# Patient Record
Sex: Male | Born: 1995 | Race: Black or African American | Hispanic: No | Marital: Single | Smoking: Never smoker
Health system: Southern US, Community
[De-identification: ages and names within clinical notes are randomized; demographics above are authoritative.]

## PROBLEM LIST (undated history)

## (undated) ENCOUNTER — Emergency Department (HOSPITAL_COMMUNITY)
Discharge status: Against Medical Advice | Payer: Medicaid Other | Attending: Emergency Medicine | Admitting: Emergency Medicine

---
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
## 2016-09-03 ENCOUNTER — Emergency Department (HOSPITAL_COMMUNITY)
Admission: EM | Admit: 2016-09-03 | Discharge: 2016-09-03 | Disposition: A | Discharge status: Home / Self Care | Payer: Medicaid Other

## 2016-09-03 ENCOUNTER — Encounter (HOSPITAL_COMMUNITY): Payer: Self-pay | Admitting: *Deleted

## 2016-09-03 ENCOUNTER — Emergency Department (HOSPITAL_COMMUNITY)
Admission: EM | Admit: 2016-09-03 | Discharge: 2016-09-03 | Disposition: A | Discharge status: Home / Self Care | Payer: Worker's Compensation | Attending: Emergency Medicine | Admitting: Emergency Medicine

## 2016-09-03 DIAGNOSIS — W260XXA Contact with knife, initial encounter: Secondary | ICD-10-CM | POA: Diagnosis not present

## 2016-09-03 DIAGNOSIS — Y939 Activity, unspecified: Secondary | ICD-10-CM | POA: Insufficient documentation

## 2016-09-03 DIAGNOSIS — Y990 Civilian activity done for income or pay: Secondary | ICD-10-CM | POA: Insufficient documentation

## 2016-09-03 DIAGNOSIS — J45909 Unspecified asthma, uncomplicated: Secondary | ICD-10-CM | POA: Insufficient documentation

## 2016-09-03 DIAGNOSIS — Y929 Unspecified place or not applicable: Secondary | ICD-10-CM | POA: Diagnosis not present

## 2016-09-03 DIAGNOSIS — S61211A Laceration without foreign body of left index finger without damage to nail, initial encounter: Secondary | ICD-10-CM | POA: Insufficient documentation

## 2016-09-03 MED ORDER — IBUPROFEN 400 MG PO TABS: 400.0000 mg | ORAL_TABLET | Freq: Four times a day (QID) | ORAL | 0 refills | Status: AC | PRN

## 2016-09-03 NOTE — ED Triage Notes (Signed)
Pt cut his left index finger with a knife while cutting lemons yesterday at 6PM. Pt has 1.5 cm laceration to index finger

## 2016-09-03 NOTE — ED Provider Notes (Signed)
[facility]-EMERGENCY DEPT Provider Note   CSN: [id number] Arrival date & time: 09/03/16  1540  By signing my name below, I, Soijett Blue, attest that this documentation has been prepared under the direction and in the presence of Mathews RobinsonsJessica Joseguadalupe Stan, PA-C Electronically Signed: Soijett Blue, ED Scribe. 09/03/16. 4:40 PM.  History   Chief Complaint Chief Complaint  Patient presents with  . Extremity Laceration    HPI Melvin Craig is a 20 y.o. male who presents to the Emergency Department complaining of extremity laceration onset 6 PM yesterday. He notes that he was cutting lemons while at work when he cut his left index finger with a knife. He states that he is having associated symptoms of left index finger pain. Pt notes that his left index finger pain is worsened with extension and alleviated with rest. He states that he has tried advil and wound care with mild relief of his symptoms. He denies color change, joint swelling, dizziness, lightheadedness, CP, SOB, and any other symptoms. Denies allergies to any medications.    The history is provided by the patient. No language interpreter was used.    Past Medical History:  Diagnosis Date  . Asthma     Patient Active Problem List   Diagnosis Date Noted  . Asthma in pediatric patient 05/17/2014  . Rhinitis, allergic 05/17/2014    History reviewed. No pertinent surgical history.     Home Medications    Prior to Admission medications   Medication Sig Start Date End Date Taking? Authorizing Provider  albuterol (PROVENTIL HFA;VENTOLIN HFA) 108 (90 BASE) MCG/ACT inhaler Inhale 2 puffs into the lungs every 4 (four) hours as needed for wheezing. Use with spacer 05/16/14   Maree ErieAngela J Stanley, MD  albuterol (PROVENTIL) (2.5 MG/3ML) 0.083% nebulizer solution Take 2.5 mg by nebulization every 6 (six) hours as needed. For shortness of breath    Historical Provider, MD  beclomethasone (QVAR) 40 MCG/ACT inhaler Inhale 2 puffs into the lungs twice  daily for asthma prevention 05/16/14   Maree ErieAngela J Stanley, MD  cetirizine (ZYRTEC) 10 MG tablet Take 1 tablet (10 mg total) by mouth daily. 12/20/14   Maree ErieAngela J Stanley, MD  fluticasone Aleda Grana(FLONASE) 50 MCG/ACT nasal spray Inhale one spray into each nostril once daily for allergy control; rinse mouth and spit after use 12/20/14   Maree ErieAngela J Stanley, MD  ibuprofen (ADVIL,MOTRIN) 400 MG tablet Take 1 tablet (400 mg total) by mouth every 6 (six) hours as needed. 09/03/16   Georgiana ShoreJessica B Rylei Masella, PA-C    Family History Family History  Problem Relation Age of Onset  . Autism Brother     Social History Social History  Substance Use Topics  . Smoking status: Never Smoker  . Smokeless tobacco: Never Used  . Alcohol use Not on file     Allergies   Food   Review of Systems Review of Systems  Constitutional: Negative for chills and fever.  HENT: Negative for congestion and sore throat.   Respiratory: Negative for chest tightness, shortness of breath and wheezing.   Cardiovascular: Negative for chest pain.  Gastrointestinal: Negative for nausea and vomiting.  Musculoskeletal: Negative for joint swelling. Arthralgias: left index finger.  Skin: Positive for wound (laceration to left index). Negative for color change.  Neurological: Negative for dizziness, syncope, weakness, light-headedness and numbness.       Tingling to left index finger  All other systems reviewed and are negative.   Physical Exam Updated Vital Signs BP 137/66 (BP Location: Right Arm)  Pulse (!) 58   Temp 98.4 F (36.9 C) (Oral)   Resp 18   SpO2 100%   Physical Exam  Constitutional: He appears well-developed and well-nourished. No distress.  Patient is non-toxic appearing, sitting comfortably in chair, no acute distress  HENT:  Head: Normocephalic and atraumatic.  Eyes: EOM are normal. Right eye exhibits no discharge. Left eye exhibits no discharge.  Neck: Normal range of motion. Neck supple.  Cardiovascular: Normal  rate, regular rhythm and normal heart sounds.  Exam reveals no gallop and no friction rub.   No murmur heard. Pulmonary/Chest: Effort normal and breath sounds normal. No respiratory distress. He has no wheezes. He has no rales.  Abdominal: He exhibits no distension.  Musculoskeletal: Normal range of motion. He exhibits no edema or deformity.       Left hand: He exhibits laceration. He exhibits normal range of motion and normal capillary refill. Normal sensation noted. Normal strength noted.  Approximately 1 cm U-shaped superficial laceration of distal left index finger. FROM. Good sensation distally. Even resistance strength. No active bleeding. Good cap refill.   Neurological: He is alert.  Skin: Skin is warm and dry. Capillary refill takes less than 2 seconds. He is not diaphoretic. No erythema. No pallor.  Psychiatric: He has a normal mood and affect. His behavior is normal.  Nursing note and vitals reviewed.   ED Treatments / Results  DIAGNOSTIC STUDIES: Oxygen Saturation is 100% on RA, nl by my interpretation.    COORDINATION OF CARE: 4:32 PM Discussed treatment plan with pt at bedside which includes wound care and pt agreed to plan.   Procedures Procedures (including critical care time)  Medications Ordered in ED Medications - No data to display   Initial Impression / Assessment and Plan / ED Course  I have reviewed the triage vital signs and the nursing notes.   Clinical Course    Patient has had superficial laceration from slicing lemons at work more than 12 hours ago. Bleeding and pain have been well-controlled. Good cap refills, full range of motion, good strength and sensation. Imaging not indicated.  Finger was cleaned, dressed and placed in a splint to reduce movement of finger and reopening of the wound to allow healing. Laceration repair not indicated at this time since injury occurred nearly 24 hours ago. Wound is clean without debris or foreign body and is  involving superficial skin.  Discharge home with symptomatic relief and PCP follow up as needed.  Discussed strict return precautions. Patient was advised to return to the emergency department if experiencing any worsening of symptoms and to follow up with PCP as needed and he understood.  Patient was discussed with Roxy Horsemanobert Browning PA-C, who agrees with assessment and plan.  Final Clinical Impressions(s) / ED Diagnoses   Final diagnoses:  Laceration of left index finger without foreign body without damage to nail, initial encounter    New Prescriptions New Prescriptions   IBUPROFEN (ADVIL,MOTRIN) 400 MG TABLET    Take 1 tablet (400 mg total) by mouth every 6 (six) hours as needed.   I personally performed the services described in this documentation, which was scribed in my presence. The recorded information has been reviewed and is accurate.    Georgiana ShoreJessica B Jeaninne Lodico, PA-C 09/03/16 1707    Gerhard Munchobert Lockwood, MD 09/03/16 [phone]

## 2017-07-16 ENCOUNTER — Encounter (HOSPITAL_COMMUNITY): Payer: Self-pay | Admitting: *Deleted

## 2017-07-16 ENCOUNTER — Emergency Department (HOSPITAL_COMMUNITY)
Admission: EM | Admit: 2017-07-16 | Discharge: 2017-07-16 | Disposition: A | Discharge status: Home / Self Care | Payer: Medicaid Other | Attending: Emergency Medicine | Admitting: Emergency Medicine

## 2017-07-16 DIAGNOSIS — R300 Dysuria: Secondary | ICD-10-CM | POA: Diagnosis not present

## 2017-07-16 DIAGNOSIS — R369 Urethral discharge, unspecified: Secondary | ICD-10-CM | POA: Insufficient documentation

## 2017-07-16 DIAGNOSIS — Z79899 Other long term (current) drug therapy: Secondary | ICD-10-CM | POA: Diagnosis not present

## 2017-07-16 DIAGNOSIS — Z202 Contact with and (suspected) exposure to infections with a predominantly sexual mode of transmission: Secondary | ICD-10-CM | POA: Diagnosis not present

## 2017-07-16 DIAGNOSIS — Z791 Long term (current) use of non-steroidal anti-inflammatories (NSAID): Secondary | ICD-10-CM | POA: Diagnosis not present

## 2017-07-16 DIAGNOSIS — J45909 Unspecified asthma, uncomplicated: Secondary | ICD-10-CM | POA: Diagnosis not present

## 2017-07-16 LAB — URINALYSIS, ROUTINE W REFLEX MICROSCOPIC
BILIRUBIN URINE: NEGATIVE
Bacteria, UA: NONE SEEN
GLUCOSE, UA: NEGATIVE mg/dL
HGB URINE DIPSTICK: NEGATIVE
KETONES UR: NEGATIVE mg/dL
NITRITE: NEGATIVE
PH: 7 (ref 5.0–8.0)
Protein, ur: NEGATIVE mg/dL
Specific Gravity, Urine: 1.015 (ref 1.005–1.030)
Squamous Epithelial / LPF: NONE SEEN

## 2017-07-16 MED ORDER — CEFTRIAXONE SODIUM 250 MG IJ SOLR
250.0000 mg | Freq: Once | INTRAMUSCULAR | Status: AC
  Administered 2017-07-16: 250 mg via INTRAMUSCULAR
  Filled 2017-07-16: qty 250

## 2017-07-16 MED ORDER — STERILE WATER FOR INJECTION IJ SOLN
INTRAMUSCULAR | Status: DC
Start: 2017-07-16 — End: 2017-07-16
  Filled 2017-07-16: qty 10

## 2017-07-16 MED ORDER — AZITHROMYCIN 250 MG PO TABS
1000.0000 mg | ORAL_TABLET | Freq: Once | ORAL | Status: AC
  Administered 2017-07-16: 1000 mg via ORAL
  Filled 2017-07-16: qty 4

## 2017-07-16 NOTE — ED Provider Notes (Signed)
[facility]-EMERGENCY DEPT Provider Note   CSN: [id number] Arrival date & time: 07/16/17  1445     History   Chief Complaint Chief Complaint  Patient presents with  . SEXUALLY TRANSMITTED DISEASE    HPI Melvin Craig is a 21 y.o. male.  HPI   Melvin Craig is a 21 y.o. male, with a history of Asthma, presenting to the ED with dysuria and yellow penile discharge beginning this morning. Unprotected sex Oct 18 with random male.  Denies hematuria, penile swelling, testicular pain/swelling, lesions, fever/chills, N/V, abdominal pain, or any other complaints. Denies history of STD.      Past Medical History:  Diagnosis Date  . Asthma     Patient Active Problem List   Diagnosis Date Noted  . Asthma in pediatric patient 05/17/2014  . Rhinitis, allergic 05/17/2014    History reviewed. No pertinent surgical history.     Home Medications    Prior to Admission medications   Medication Sig Start Date End Date Taking? Authorizing Provider  albuterol (PROVENTIL HFA;VENTOLIN HFA) 108 (90 BASE) MCG/ACT inhaler Inhale 2 puffs into the lungs every 4 (four) hours as needed for wheezing. Use with spacer 05/16/14   Maree Erie, MD  albuterol (PROVENTIL) (2.5 MG/3ML) 0.083% nebulizer solution Take 2.5 mg by nebulization every 6 (six) hours as needed. For shortness of breath    [provider]  beclomethasone (QVAR) 40 MCG/ACT inhaler Inhale 2 puffs into the lungs twice daily for asthma prevention 05/16/14   Maree Erie, MD  cetirizine (ZYRTEC) 10 MG tablet Take 1 tablet (10 mg total) by mouth daily. 12/20/14   Maree Erie, MD  fluticasone Aleda Grana) 50 MCG/ACT nasal spray Inhale one spray into each nostril once daily for allergy control; rinse mouth and spit after use 12/20/14   Maree Erie, MD  ibuprofen (ADVIL,MOTRIN) 400 MG tablet Take 1 tablet (400 mg total) by mouth every 6 (six) hours as needed. 09/03/16   Georgiana Shore, PA-C      Family History Family History  Problem Relation Age of Onset  . Autism Brother     Social History Social History  Substance Use Topics  . Smoking status: Never Smoker  . Smokeless tobacco: Never Used  . Alcohol use Not on file     Allergies   Food   Review of Systems Review of Systems  Constitutional: Negative for chills, diaphoresis and fever.  Gastrointestinal: Negative for abdominal pain, diarrhea, nausea and vomiting.  Genitourinary: Positive for discharge and dysuria. Negative for hematuria, scrotal swelling and testicular pain.  Musculoskeletal: Negative for back pain.     Physical Exam Updated Vital Signs BP (!) 145/81 (BP Location: Right Arm)   Pulse (!) 51   Temp 98.1 F (36.7 C) (Oral)   Resp 18   SpO2 100%   Physical Exam  Constitutional: He appears well-developed and well-nourished. No distress.  HENT:  Head: Normocephalic and atraumatic.  Eyes: Conjunctivae are normal.  Neck: Neck supple.  Cardiovascular: Normal rate and regular rhythm.   Pulmonary/Chest: Effort normal.  Genitourinary: Circumcised. No penile tenderness. Discharge found.  Genitourinary Comments: Moderate, thick, yellow penile discharge.  Penis, scrotum, and testicles without swelling, lesions, or tenderness. Cremasteric reflex intact. No inguinal lymphadenopathy. Otherwise normal male genitalia. PA student, Baxter Hire, served as chaperone during the exam.  Neurological: He is alert.  Skin: Skin is warm and dry. He is not diaphoretic. No pallor.  Psychiatric: He has a normal mood and affect. His  behavior is normal.  Nursing note and vitals reviewed.    ED Treatments / Results  Labs (all labs ordered are listed, but only abnormal results are displayed) Labs Reviewed  URINALYSIS, ROUTINE W REFLEX MICROSCOPIC - Abnormal; Notable for the following:       Result Value   Leukocytes, UA MODERATE (*)    All other components within normal limits  RPR  HIV ANTIBODY (ROUTINE TESTING)   GC/CHLAMYDIA PROBE AMP ([place]) NOT AT [facility]    EKG  EKG Interpretation None       Radiology No results found.  Procedures Procedures (including critical care time)  Medications Ordered in ED Medications  azithromycin (ZITHROMAX) tablet 1,000 mg (not administered)  cefTRIAXone (ROCEPHIN) injection 250 mg (not administered)     Initial Impression / Assessment and Plan / ED Course  I have reviewed the triage vital signs and the nursing notes.  Pertinent labs & imaging results that were available during my care of the patient were reviewed by me and considered in my medical decision making (see chart for details).     Patient presents with penile discharge and dysuria.  Tested and empirically treated. Safe sex practices discussed.      Final Clinical Impressions(s) / ED Diagnoses   Final diagnoses:  Penile discharge    New Prescriptions New Prescriptions   No medications on file     Concepcion LivingJoy, Ainhoa Rallo C, PA-C 07/16/17 2011    Bethann BerkshireZammit, Joseph, MD 07/17/17 [phone]

## 2017-07-16 NOTE — ED Triage Notes (Signed)
Pt complains of burning while urinating and yellow discharge since last night. Pt states he had unprotected sex 3 days ago.

## 2017-07-16 NOTE — Discharge Instructions (Signed)
You have been tested for STDs. Some of these results are still pending. Any abnormalities will be called to you. You have been empirically treated for gonorrhea and Chlamydia. This does not mean you necessarily have these diseases, treatment is precautionary. Be sure to follow safe sex practices, including monogamy and/or condom use. °For the treatment to be fully effective, avoid all sexual contact for at least 2 weeks after medication administration. °

## 2017-07-17 LAB — RPR: RPR Ser Ql: NONREACTIVE

## 2017-07-17 LAB — HIV ANTIBODY (ROUTINE TESTING W REFLEX): HIV SCREEN 4TH GENERATION: NONREACTIVE

## 2017-07-19 LAB — GC/CHLAMYDIA PROBE AMP (~~LOC~~) NOT AT ARMC
CHLAMYDIA, DNA PROBE: POSITIVE — AB
NEISSERIA GONORRHEA: POSITIVE — AB

## 2021-02-06 ENCOUNTER — Encounter (HOSPITAL_COMMUNITY): Payer: Self-pay

## 2021-02-06 ENCOUNTER — Other Ambulatory Visit: Payer: Self-pay

## 2021-02-06 ENCOUNTER — Emergency Department (HOSPITAL_COMMUNITY)
Admission: EM | Admit: 2021-02-06 | Discharge: 2021-02-06 | Disposition: A | Discharge status: Home / Self Care | Payer: Medicaid Other | Attending: Emergency Medicine | Admitting: Emergency Medicine

## 2021-02-06 DIAGNOSIS — J069 Acute upper respiratory infection, unspecified: Secondary | ICD-10-CM | POA: Insufficient documentation

## 2021-02-06 DIAGNOSIS — J45909 Unspecified asthma, uncomplicated: Secondary | ICD-10-CM | POA: Insufficient documentation

## 2021-02-06 DIAGNOSIS — U071 COVID-19: Secondary | ICD-10-CM | POA: Insufficient documentation

## 2021-02-06 DIAGNOSIS — Z28310 Unvaccinated for covid-19: Secondary | ICD-10-CM | POA: Insufficient documentation

## 2021-02-06 DIAGNOSIS — Z20822 Contact with and (suspected) exposure to covid-19: Secondary | ICD-10-CM

## 2021-02-06 DIAGNOSIS — Z7951 Long term (current) use of inhaled steroids: Secondary | ICD-10-CM | POA: Insufficient documentation

## 2021-02-06 MED ORDER — FLUTICASONE PROPIONATE 50 MCG/ACT NA SUSP: 2.0000 | Freq: Every day | NASAL | 0 refills | Status: AC

## 2021-02-06 MED ORDER — ALBUTEROL SULFATE HFA 108 (90 BASE) MCG/ACT IN AERS: 1.0000 | INHALATION_SPRAY | Freq: Four times a day (QID) | RESPIRATORY_TRACT | 0 refills | Status: AC | PRN

## 2021-02-06 MED ORDER — CETIRIZINE HCL 10 MG PO TABS: 10.0000 mg | ORAL_TABLET | Freq: Every day | ORAL | 0 refills | Status: AC

## 2021-02-06 MED ORDER — BENZONATATE 100 MG PO CAPS: 100.0000 mg | ORAL_CAPSULE | Freq: Three times a day (TID) | ORAL | 0 refills | Status: DC

## 2021-02-06 NOTE — ED Provider Notes (Signed)
[facility]-EMERGENCY DEPT Provider Note   CSN: [id number] Arrival date & time: 02/06/21  1319     History Chief Complaint  Patient presents with  . Nasal Congestion  . Cough    Melvin Craig is a 25 y.o. male with past medical history significant for asthma who presents for evaluation of rhinorrhea.  States he has had a intermittent cough.  Symptoms began 2 days ago.  He has no chest pain or shortness of breath.  States he has had an exposure to COVID as multiple coworkers have tested positive.  He is not vaccinated.  He denies fever, chills, nausea, vomiting, hemoptysis abdominal pain, diarrhea, dysuria, unilateral leg swelling, redness or warmth.  He is not take anything for symptoms.  Denies additional aggravating or alleviating factor.  History obtained from patient and past medical records.  No interpreter used.  HPI     Past Medical History:  Diagnosis Date  . Asthma     Patient Active Problem List   Diagnosis Date Noted  . Asthma in pediatric patient 05/17/2014  . Rhinitis, allergic 05/17/2014    History reviewed. No pertinent surgical history.     Family History  Problem Relation Age of Onset  . Autism Brother     Social History   Tobacco Use  . Smoking status: Never Smoker  . Smokeless tobacco: Never Used  Vaping Use  . Vaping Use: Every day  . Substances: Nicotine, Flavoring  Substance Use Topics  . Alcohol use: Never  . Drug use: Never    Home Medications Prior to Admission medications   Medication Sig Start Date End Date Taking? Authorizing Provider  albuterol (VENTOLIN HFA) 108 (90 Base) MCG/ACT inhaler Inhale 1-2 puffs into the lungs every 6 (six) hours as needed for wheezing or shortness of breath. 02/06/21  Yes Daine Croker A, PA-C  benzonatate (TESSALON) 100 MG capsule Take 1 capsule (100 mg total) by mouth every 8 (eight) hours. 02/06/21  Yes Husain Costabile A, PA-C  cetirizine (ZYRTEC ALLERGY) 10 MG tablet Take 1  tablet (10 mg total) by mouth daily. 02/06/21  Yes Selassie Spatafore A, PA-C  fluticasone (FLONASE) 50 MCG/ACT nasal spray Place 2 sprays into both nostrils daily. 02/06/21  Yes Semaja Lymon A, PA-C  beclomethasone (QVAR) 40 MCG/ACT inhaler Inhale 2 puffs into the lungs twice daily for asthma prevention 05/16/14   Maree Erie, MD  ibuprofen (ADVIL,MOTRIN) 400 MG tablet Take 1 tablet (400 mg total) by mouth every 6 (six) hours as needed. 09/03/16   Georgiana Shore, PA-C    Allergies    Food  Review of Systems   Review of Systems  Constitutional: Negative.   HENT: Positive for congestion, postnasal drip and rhinorrhea. Negative for trouble swallowing and voice change.   Respiratory: Positive for cough. Negative for apnea, choking, chest tightness, shortness of breath, wheezing and stridor.   Cardiovascular: Negative.   Gastrointestinal: Negative.   Genitourinary: Negative.   Musculoskeletal: Negative.   Skin: Negative.   Neurological: Negative.   All other systems reviewed and are negative.   Physical Exam Updated Vital Signs BP (!) 162/83 (BP Location: Left Arm)   Pulse (!) 59   Temp 98.3 F (36.8 C) (Oral)   Resp 16   Ht 6\' 6"  (1.981 m)   Wt 77.1 kg   SpO2 98%   BMI 19.65 kg/m   Physical Exam Vitals and nursing note reviewed.  Constitutional:      General: He is not in  acute distress.    Appearance: He is well-developed. He is not ill-appearing, toxic-appearing or diaphoretic.  HENT:     Head: Normocephalic and atraumatic.     Nose: Rhinorrhea present.     Comments: Clear rhinorrhea bilateral nares.  No tenderness over frontal or maxillary sinuses    Mouth/Throat:     Comments: Posterior oropharynx clear.  Uvula midline.  No evidence of PTA or RPA.  No pooling of secretions. Eyes:     Pupils: Pupils are equal, round, and reactive to light.  Neck:     Comments: No neck stiffness or neck rigidity Cardiovascular:     Rate and Rhythm: Normal rate and regular  rhythm.     Pulses: Normal pulses.     Heart sounds: Normal heart sounds.     Comments: No murmur Pulmonary:     Effort: Pulmonary effort is normal. No respiratory distress.     Breath sounds: Normal breath sounds.     Comments: Clear to auscultation bilaterally.  Speaks in full sentences without difficulty Abdominal:     General: Bowel sounds are normal. There is no distension.     Palpations: Abdomen is soft.     Comments: Soft  Musculoskeletal:        General: Normal range of motion.     Cervical back: Normal range of motion and neck supple.     Comments: Moves all 4 extremities without difficulty  Skin:    General: Skin is warm and dry.     Capillary Refill: Capillary refill takes less than 2 seconds.  Neurological:     General: No focal deficit present.     Mental Status: He is alert and oriented to person, place, and time.     Comments: Cranial nerves 2-12 grossly intact Gait without ataxia     ED Results / Procedures / Treatments   Labs (all labs ordered are listed, but only abnormal results are displayed) Labs Reviewed  SARS CORONAVIRUS 2 (TAT 6-24 HRS)    EKG None  Radiology No results found.  Procedures Procedures   Medications Ordered in ED Medications - No data to display  ED Course  I have reviewed the triage vital signs and the nursing notes.  Pertinent labs & imaging results that were available during my care of the patient were reviewed by me and considered in my medical decision making (see chart for details).  25 year old here for evaluation of rhinorrhea and cough which began 2 days ago.  He is afebrile, nonseptic, non-ill-appearing.  Clear rhinorrhea bilateral nares.  Nontender frontal or maxillary sinuses.  His posterior oropharynx is clear.  No neck stiffness or neck rigidity.  No meningismus.  His heart and lungs are clear.  Abdomen soft, nontender.  No rashes or lesions.  He is nonfocal neuro exam without deficits.  Recent exposure to  multiple coworkers were positive for COVID, patient is not vaccinated.  Patient likely with viral URI.  Discussed symptomatic management.  Orders placed for COVID test.  Work note provided.  Of note patient noted to be hypertensive here.  I have low suspicion for acute hypertensive urgency or emergency.  I did recheck this in the room and has improved into the 140s systolic.  Discussed with patient close PCP follow-up for recheck of blood pressure.  He is agreeable.  The patient has been appropriately medically screened and/or stabilized in the ED. I have low suspicion for any other emergent medical condition which would require further screening, evaluation or treatment  in the ED or require inpatient management.  Patient is hemodynamically stable and in no acute distress.  Patient able to ambulate in department prior to ED.  Evaluation does not show acute pathology that would require ongoing or additional emergent interventions while in the emergency department or further inpatient treatment.  I have discussed the diagnosis with the patient and answered all questions.  Pain is been managed while in the emergency department and patient has no further complaints prior to discharge.  Patient is comfortable with plan discussed in room and is stable for discharge at this time.  I have discussed strict return precautions for returning to the emergency department.  Patient was encouraged to follow-up with PCP/specialist refer to at discharge.    MDM Rules/Calculators/A&P                         Kristofer Schaffert was evaluated in Emergency Department on 02/06/2021 for the symptoms described in the history of present illness. He was evaluated in the context of the global COVID-19 pandemic, which necessitated consideration that the patient might be at risk for infection with the SARS-CoV-2 virus that causes COVID-19. Institutional protocols and algorithms that pertain to the evaluation of patients at risk for COVID-19  are in a state of rapid change based on information released by regulatory bodies including the CDC and federal and state organizations. These policies and algorithms were followed during the patient's care in the ED. Final Clinical Impression(s) / ED Diagnoses Final diagnoses:  Viral URI with cough  Person under investigation for COVID-19    Rx / DC Orders ED Discharge Orders         Ordered    fluticasone (FLONASE) 50 MCG/ACT nasal spray  Daily        02/06/21 1436    albuterol (VENTOLIN HFA) 108 (90 Base) MCG/ACT inhaler  Every 6 hours PRN        02/06/21 1436    benzonatate (TESSALON) 100 MG capsule  Every 8 hours        02/06/21 1436    cetirizine (ZYRTEC ALLERGY) 10 MG tablet  Daily        02/06/21 1436           Tenee Wish A, PA-C 02/06/21 1437    Little, Ambrose Finland, MD 02/06/21 1515

## 2021-02-06 NOTE — ED Triage Notes (Signed)
Patient co nasal congestion and a cough x 2 days. patient also reports fatigue 3 days ago, but not now.

## 2021-02-06 NOTE — Discharge Instructions (Signed)
Your COVID test is pending.  I have written for a few medications to help with your symptoms.  If you receive a negative test result you may return to work.  If positive you will need to be out for 1 week.  Return for any worsening symptoms

## 2021-02-07 LAB — SARS CORONAVIRUS 2 (TAT 6-24 HRS): SARS Coronavirus 2: POSITIVE — AB

## 2021-02-08 ENCOUNTER — Telehealth: Payer: Self-pay | Admitting: Adult Health

## 2021-02-08 NOTE — Telephone Encounter (Signed)
Called to discuss with patient about COVID-19 symptoms and the use of one of the available treatments for those with mild to moderate Covid symptoms and at a high risk of hospitalization.  Pt appears to qualify for outpatient treatment due to co-morbid conditions and/or a member of an at-risk group in accordance with the FDA Emergency Use Authorization.    Symptom onset: 02/04/2021 Vaccinated: no Booster? no Immunocompromised? no Qualifiers: chronic lung disease/social risk NIH Criteria: 2  Unable to reach pt - unable to leave message.     Noreene Filbert

## 2022-08-11 ENCOUNTER — Ambulatory Visit
Admission: EM | Admit: 2022-08-11 | Discharge: 2022-08-11 | Disposition: A | Discharge status: Home / Self Care | Payer: PRIVATE HEALTH INSURANCE | Attending: Physician Assistant | Admitting: Physician Assistant

## 2022-08-11 DIAGNOSIS — J40 Bronchitis, not specified as acute or chronic: Secondary | ICD-10-CM

## 2022-08-11 DIAGNOSIS — J329 Chronic sinusitis, unspecified: Secondary | ICD-10-CM

## 2022-08-11 MED ORDER — PREDNISONE 20 MG PO TABS: 40.0000 mg | ORAL_TABLET | Freq: Every day | ORAL | 0 refills | Status: AC

## 2022-08-11 MED ORDER — AMOXICILLIN-POT CLAVULANATE 875-125 MG PO TABS: 1.0000 | ORAL_TABLET | Freq: Two times a day (BID) | ORAL | 0 refills | Status: AC

## 2022-08-11 NOTE — ED Provider Notes (Signed)
[facility]    CSN: [id number] Arrival date & time: 08/11/22  1029      History   Chief Complaint Chief Complaint  Patient presents with   URI    HPI Melvin Craig is a 26 y.o. male.   Patient here today for ration of respiratory symptoms has been ongoing for a week. He has continued to have congestion, cough, sore throat. He states he did have fever yesterday of 100.  He notes that cough seems to be more continuous and feels similar to when he had asthma as a child.  He has not had any diarrhea but does report some vomiting yesterday.   The history is provided by the patient.  URI Presenting symptoms: congestion, cough, fever and sore throat   Presenting symptoms: no ear pain     Past Medical History:  Diagnosis Date   Asthma     Patient Active Problem List   Diagnosis Date Noted   Asthma in pediatric patient 05/17/2014   Rhinitis, allergic 05/17/2014    History reviewed. No pertinent surgical history.     Home Medications    Prior to Admission medications   Medication Sig Start Date End Date Taking? Authorizing Provider  amoxicillin-clavulanate (AUGMENTIN) 875-125 MG tablet Take 1 tablet by mouth every 12 (twelve) hours. 08/11/22  Yes Tomi Bamberger, PA-C  predniSONE (DELTASONE) 20 MG tablet Take 2 tablets (40 mg total) by mouth daily with breakfast for 5 days. 08/11/22 08/16/22 Yes Tomi Bamberger, PA-C  albuterol (VENTOLIN HFA) 108 (90 Base) MCG/ACT inhaler Inhale 1-2 puffs into the lungs every 6 (six) hours as needed for wheezing or shortness of breath. 02/06/21   Henderly, Britni A, PA-C  beclomethasone (QVAR) 40 MCG/ACT inhaler Inhale 2 puffs into the lungs twice daily for asthma prevention 05/16/14   Maree Erie, MD  benzonatate (TESSALON) 100 MG capsule Take 1 capsule (100 mg total) by mouth every 8 (eight) hours. Patient not taking: Reported on 08/11/2022 02/06/21   Henderly, Britni A, PA-C  cetirizine (ZYRTEC ALLERGY) 10 MG tablet  Take 1 tablet (10 mg total) by mouth daily. 02/06/21   Henderly, Britni A, PA-C  fluticasone (FLONASE) 50 MCG/ACT nasal spray Place 2 sprays into both nostrils daily. 02/06/21   Henderly, Britni A, PA-C  ibuprofen (ADVIL,MOTRIN) 400 MG tablet Take 1 tablet (400 mg total) by mouth every 6 (six) hours as needed. 09/03/16   Georgiana Shore, PA-C    Family History Family History  Problem Relation Age of Onset   Autism Brother     Social History Social History   Tobacco Use   Smoking status: Never   Smokeless tobacco: Never  Vaping Use   Vaping Use: Some days   Substances: Nicotine, Flavoring  Substance Use Topics   Alcohol use: Never   Drug use: Never     Allergies   Food   Review of Systems Review of Systems  Constitutional:  Positive for fever.  HENT:  Positive for congestion and sore throat. Negative for ear pain.   Eyes:  Negative for discharge and redness.  Respiratory:  Positive for cough. Negative for shortness of breath.   Gastrointestinal:  Positive for vomiting. Negative for abdominal pain, diarrhea and nausea.     Physical Exam Triage Vital Signs ED Triage Vitals  Enc Vitals Group     BP --      Pulse Rate 08/11/22 1123 67     Resp 08/11/22 1123 19  Temp 08/11/22 1123 98.1 F (36.7 C)     Temp Source 08/11/22 1123 Oral     SpO2 08/11/22 1123 98 %     Weight --      Height --      Head Circumference --      Peak Flow --      Pain Score 08/11/22 1122 4     Pain Loc --      Pain Edu? --      Excl. in GC? --    No data found.  Updated Vital Signs Pulse 67   Temp 98.1 F (36.7 C) (Oral)   Resp 19   SpO2 98%      Physical Exam Vitals and nursing note reviewed.  Constitutional:      General: He is not in acute distress.    Appearance: Normal appearance. He is not ill-appearing.  HENT:     Head: Normocephalic and atraumatic.     Nose: Congestion present.     Mouth/Throat:     Mouth: Mucous membranes are moist.     Pharynx: Oropharynx  is clear. No oropharyngeal exudate or posterior oropharyngeal erythema.  Eyes:     Conjunctiva/sclera: Conjunctivae normal.  Cardiovascular:     Rate and Rhythm: Normal rate and regular rhythm.     Heart sounds: Normal heart sounds. No murmur heard. Pulmonary:     Effort: Pulmonary effort is normal. No respiratory distress.     Breath sounds: Rhonchi present. No wheezing or rales.     Comments: Mild rhonchi initially to right lower lung fields- cleared with cough Skin:    General: Skin is warm and dry.  Neurological:     Mental Status: He is alert.  Psychiatric:        Mood and Affect: Mood normal.        Thought Content: Thought content normal.      UC Treatments / Results  Labs (all labs ordered are listed, but only abnormal results are displayed) Labs Reviewed - No data to display  EKG   Radiology No results found.  Procedures Procedures (including critical care time)  Medications Ordered in UC Medications - No data to display  Initial Impression / Assessment and Plan / UC Course  I have reviewed the triage vital signs and the nursing notes.  Pertinent labs & imaging results that were available during my care of the patient were reviewed by me and considered in my medical decision making (see chart for details).    Steroid burst prescribed along with augmentin to cover bronchitis and possible sinusitis. Encouraged follow up if no gradual improvement or with any further concerns.   Final Clinical Impressions(s) / UC Diagnoses   Final diagnoses:  Sinobronchitis   Discharge Instructions   None    ED Prescriptions     Medication Sig Dispense Auth. Provider   predniSONE (DELTASONE) 20 MG tablet Take 2 tablets (40 mg total) by mouth daily with breakfast for 5 days. 10 tablet Tomi Bamberger, PA-C   amoxicillin-clavulanate (AUGMENTIN) 875-125 MG tablet Take 1 tablet by mouth every 12 (twelve) hours. 14 tablet Tomi Bamberger, PA-C      PDMP not reviewed  this encounter.   Tomi Bamberger, PA-C 08/11/22 1210

## 2022-08-11 NOTE — ED Triage Notes (Signed)
Pt presents to [facility] with co of uri symptoms cough congestion fever for 1 week. Pt reports his work sent him home yesterday and he needs a note to go back. Pt reports symptoms are slightly better but still having congestion cough and sore throat

## 2023-01-09 ENCOUNTER — Ambulatory Visit
Admission: EM | Admit: 2023-01-09 | Discharge: 2023-01-09 | Disposition: A | Discharge status: Home / Self Care | Payer: PRIVATE HEALTH INSURANCE | Attending: Internal Medicine | Admitting: Internal Medicine

## 2023-01-09 DIAGNOSIS — R053 Chronic cough: Secondary | ICD-10-CM | POA: Diagnosis not present

## 2023-01-09 DIAGNOSIS — R062 Wheezing: Secondary | ICD-10-CM

## 2023-01-09 MED ORDER — BENZONATATE 100 MG PO CAPS: 100.0000 mg | ORAL_CAPSULE | Freq: Three times a day (TID) | ORAL | 0 refills | Status: AC | PRN

## 2023-01-09 MED ORDER — ALBUTEROL SULFATE HFA 108 (90 BASE) MCG/ACT IN AERS: 1.0000 | INHALATION_SPRAY | Freq: Four times a day (QID) | RESPIRATORY_TRACT | 0 refills | Status: AC | PRN

## 2023-01-09 MED ORDER — PREDNISONE 20 MG PO TABS: 40.0000 mg | ORAL_TABLET | Freq: Every day | ORAL | 0 refills | Status: AC

## 2023-01-09 NOTE — ED Provider Notes (Signed)
[facility]    CSN: [id number] Arrival date & time: 01/09/23  1049      History   Chief Complaint Chief Complaint  Patient presents with   Cough    HPI Melvin Craig is a 27 y.o. male.   Patient presents with cough and shortness of breath that has been present for about 2 weeks.  Reports symptoms started with nasal congestion and runny nose which is now resolved.  Denies any current shortness of breath but reports shortness of breath with exertion.  He is left with a persistent cough that is dry sometimes and productive at other times.  He denies any fever or known sick contacts.  Reports history of asthma in childhood but no complications since.  He does not use any inhalers currently for his asthma.  He does not report any medications for symptoms. Patient states that he took "two pills" of leftover prednisone today.    Cough   Past Medical History:  Diagnosis Date   Asthma     Patient Active Problem List   Diagnosis Date Noted   Asthma in pediatric patient 05/17/2014   Rhinitis, allergic 05/17/2014    History reviewed. No pertinent surgical history.     Home Medications    Prior to Admission medications   Medication Sig Start Date End Date Taking? Authorizing Provider  albuterol (VENTOLIN HFA) 108 (90 Base) MCG/ACT inhaler Inhale 1-2 puffs into the lungs every 6 (six) hours as needed for wheezing or shortness of breath. 01/09/23  Yes Malgorzata Albert, Rolly Salter E, FNP  benzonatate (TESSALON) 100 MG capsule Take 1 capsule (100 mg total) by mouth every 8 (eight) hours as needed for cough. 01/09/23  Yes Marium Ragan, Rolly Salter E, FNP  predniSONE (DELTASONE) 20 MG tablet Take 2 tablets (40 mg total) by mouth daily for 4 days. 01/09/23 01/13/23 Yes Kostantinos Tallman, Acie Fredrickson, FNP  albuterol (VENTOLIN HFA) 108 (90 Base) MCG/ACT inhaler Inhale 1-2 puffs into the lungs every 6 (six) hours as needed for wheezing or shortness of breath. 02/06/21   Henderly, Britni A, PA-C  amoxicillin-clavulanate  (AUGMENTIN) 875-125 MG tablet Take 1 tablet by mouth every 12 (twelve) hours. 08/11/22   Tomi Bamberger, PA-C  beclomethasone (QVAR) 40 MCG/ACT inhaler Inhale 2 puffs into the lungs twice daily for asthma prevention 05/16/14   Maree Erie, MD  cetirizine (ZYRTEC ALLERGY) 10 MG tablet Take 1 tablet (10 mg total) by mouth daily. 02/06/21   Henderly, Britni A, PA-C  fluticasone (FLONASE) 50 MCG/ACT nasal spray Place 2 sprays into both nostrils daily. 02/06/21   Henderly, Britni A, PA-C  ibuprofen (ADVIL,MOTRIN) 400 MG tablet Take 1 tablet (400 mg total) by mouth every 6 (six) hours as needed. 09/03/16   Georgiana Shore, PA-C    Family History Family History  Problem Relation Age of Onset   Autism Brother     Social History Social History   Tobacco Use   Smoking status: Never   Smokeless tobacco: Never  Vaping Use   Vaping Use: Some days   Substances: Nicotine, Flavoring  Substance Use Topics   Alcohol use: Never   Drug use: Never     Allergies   Food   Review of Systems Review of Systems Per HPI  Physical Exam Triage Vital Signs ED Triage Vitals [01/09/23 1117]  Enc Vitals Group     BP 128/70     Pulse Rate 61     Resp 16     Temp 98.3 F (36.8 C)  Temp Source Oral     SpO2 97 %     Weight      Height      Head Circumference      Peak Flow      Pain Score 0     Pain Loc      Pain Edu?      Excl. in GC?    No data found.  Updated Vital Signs BP 128/70 (BP Location: Right Arm)   Pulse 61   Temp 98.3 F (36.8 C) (Oral)   Resp 16   SpO2 97%   Visual Acuity Right Eye Distance:   Left Eye Distance:   Bilateral Distance:    Right Eye Near:   Left Eye Near:    Bilateral Near:     Physical Exam Constitutional:      General: He is not in acute distress.    Appearance: Normal appearance. He is not toxic-appearing or diaphoretic.  HENT:     Head: Normocephalic and atraumatic.     Right Ear: Tympanic membrane and ear canal normal.     Left  Ear: Tympanic membrane and ear canal normal.     Nose: No congestion.     Mouth/Throat:     Mouth: Mucous membranes are moist.     Pharynx: No posterior oropharyngeal erythema.  Eyes:     Extraocular Movements: Extraocular movements intact.     Conjunctiva/sclera: Conjunctivae normal.     Pupils: Pupils are equal, round, and reactive to light.  Cardiovascular:     Rate and Rhythm: Normal rate and regular rhythm.     Pulses: Normal pulses.     Heart sounds: Normal heart sounds.  Pulmonary:     Effort: Pulmonary effort is normal. No respiratory distress.     Breath sounds: No stridor. Wheezing present. No rhonchi or rales.     Comments: Wheezing noted to auscultation bilaterally. Abdominal:     General: Abdomen is flat. Bowel sounds are normal.     Palpations: Abdomen is soft.  Musculoskeletal:        General: Normal range of motion.     Cervical back: Normal range of motion.  Skin:    General: Skin is warm and dry.  Neurological:     General: No focal deficit present.     Mental Status: He is alert and oriented to person, place, and time. Mental status is at baseline.  Psychiatric:        Mood and Affect: Mood normal.        Behavior: Behavior normal.      UC Treatments / Results  Labs (all labs ordered are listed, but only abnormal results are displayed) Labs Reviewed - No data to display  EKG   Radiology No results found.  Procedures Procedures (including critical care time)  Medications Ordered in UC Medications - No data to display  Initial Impression / Assessment and Plan / UC Course  I have reviewed the triage vital signs and the nursing notes.  Pertinent labs & imaging results that were available during my care of the patient were reviewed by me and considered in my medical decision making (see chart for details).     Suspect possible viral bronchitis versus asthma exacerbation.  Patient does have mild wheezing on exam.  Suggested albuterol nebulizer  treatment and chest x-ray but patient declined both of these.  Therefore, will treat with prednisone, benzonatate, and albuterol inhaler.  Patient reported that he took some leftover prednisone today.  Therefore, patient was advised to start prednisone tomorrow.  Advised strict follow-up precautions.  Patient verbalized understanding and was agreeable with plan. Final Clinical Impressions(s) / UC Diagnoses   Final diagnoses:  Wheezing  Persistent cough     Discharge Instructions      I have prescribed you prednisone to start taking tomorrow.  Use albuterol inhaler consistently to help alleviate wheezing.  Cough medication prescribed.  Follow-up if any symptoms persist or worsen.    ED Prescriptions     Medication Sig Dispense Auth. Provider   predniSONE (DELTASONE) 20 MG tablet Take 2 tablets (40 mg total) by mouth daily for 4 days. 8 tablet [place], Rolly Salter E, [place]   albuterol (VENTOLIN HFA) 108 (90 Base) MCG/ACT inhaler Inhale 1-2 puffs into the lungs every 6 (six) hours as needed for wheezing or shortness of breath. 1 each [place], [place] E, [place]   benzonatate (TESSALON) 100 MG capsule Take 1 capsule (100 mg total) by mouth every 8 (eight) hours as needed for cough. 21 capsule [place], Acie Fredrickson, [place]      PDMP not reviewed this encounter.   Gustavus Bryant, [place] 01/09/23 1243

## 2023-01-09 NOTE — Discharge Instructions (Signed)
I have prescribed you prednisone to start taking tomorrow.  Use albuterol inhaler consistently to help alleviate wheezing.  Cough medication prescribed.  Follow-up if any symptoms persist or worsen.

## 2023-01-09 NOTE — ED Triage Notes (Signed)
Pt c/o cough, hard to take a deep breath, chest congestion onset ~ 1 week ago.
